# Patient Record
Sex: Female | Born: 1955 | Marital: Married | Smoking: Current every day smoker
Health system: Southern US, Community
[De-identification: ages and names within clinical notes are randomized; demographics above are authoritative.]

## PROBLEM LIST (undated history)

## (undated) DIAGNOSIS — I10 Essential (primary) hypertension: Secondary | ICD-10-CM

## (undated) DIAGNOSIS — E785 Hyperlipidemia, unspecified: Secondary | ICD-10-CM

## (undated) DIAGNOSIS — R519 Headache, unspecified: Secondary | ICD-10-CM

## (undated) DIAGNOSIS — R51 Headache: Secondary | ICD-10-CM

## (undated) DIAGNOSIS — F17200 Nicotine dependence, unspecified, uncomplicated: Secondary | ICD-10-CM

## (undated) HISTORY — PX: APPENDECTOMY: SHX54

---

## 2015-11-16 ENCOUNTER — Ambulatory Visit: Payer: Self-pay | Admitting: General Surgery

## 2015-12-23 ENCOUNTER — Encounter (HOSPITAL_BASED_OUTPATIENT_CLINIC_OR_DEPARTMENT_OTHER): Payer: Self-pay | Admitting: *Deleted

## 2015-12-28 ENCOUNTER — Other Ambulatory Visit: Payer: Self-pay | Admitting: General Surgery

## 2015-12-28 ENCOUNTER — Ambulatory Visit
Admission: RE | Admit: 2015-12-28 | Discharge: 2015-12-28 | Disposition: A | Discharge status: Home / Self Care | Payer: 59 | Source: Ambulatory Visit | Attending: General Surgery | Admitting: General Surgery

## 2015-12-28 ENCOUNTER — Encounter (HOSPITAL_BASED_OUTPATIENT_CLINIC_OR_DEPARTMENT_OTHER)
Admission: RE | Admit: 2015-12-28 | Discharge: 2015-12-28 | Disposition: A | Discharge status: Home / Self Care | Payer: 59 | Source: Ambulatory Visit | Attending: General Surgery | Admitting: General Surgery

## 2015-12-28 DIAGNOSIS — Z01811 Encounter for preprocedural respiratory examination: Secondary | ICD-10-CM

## 2015-12-28 DIAGNOSIS — Z01812 Encounter for preprocedural laboratory examination: Secondary | ICD-10-CM | POA: Insufficient documentation

## 2015-12-28 DIAGNOSIS — Z01810 Encounter for preprocedural cardiovascular examination: Secondary | ICD-10-CM | POA: Insufficient documentation

## 2015-12-28 LAB — CBC WITH DIFFERENTIAL/PLATELET
BASOS ABS: 0 10*3/uL (ref 0.0–0.1)
Basophils Relative: 0 %
EOS ABS: 0.1 10*3/uL (ref 0.0–0.7)
EOS PCT: 2 %
HCT: 42.1 % (ref 36.0–46.0)
Hemoglobin: 14.4 g/dL (ref 12.0–15.0)
LYMPHS ABS: 3.1 10*3/uL (ref 0.7–4.0)
Lymphocytes Relative: 34 %
MCH: 30.3 pg (ref 26.0–34.0)
MCHC: 34.2 g/dL (ref 30.0–36.0)
MCV: 88.4 fL (ref 78.0–100.0)
Monocytes Absolute: 0.6 10*3/uL (ref 0.1–1.0)
Monocytes Relative: 6 %
Neutro Abs: 5.5 10*3/uL (ref 1.7–7.7)
Neutrophils Relative %: 58 %
PLATELETS: 282 10*3/uL (ref 150–400)
RBC: 4.76 MIL/uL (ref 3.87–5.11)
RDW: 15.5 % (ref 11.5–15.5)
WBC: 9.4 10*3/uL (ref 4.0–10.5)

## 2015-12-28 LAB — BASIC METABOLIC PANEL
Anion gap: 6 (ref 5–15)
BUN: 16 mg/dL (ref 6–20)
CALCIUM: 10 mg/dL (ref 8.9–10.3)
CO2: 27 mmol/L (ref 22–32)
CREATININE: 0.62 mg/dL (ref 0.44–1.00)
Chloride: 107 mmol/L (ref 101–111)
GFR calc Af Amer: >60 mL/min (ref >60)
Glucose, Bld: 92 mg/dL (ref 65–99)
Potassium: 5.1 mmol/L (ref 3.5–5.1)
SODIUM: 140 mmol/L (ref 135–145)

## 2015-12-28 NOTE — Progress Notes (Signed)
Pt arrived for PAT appointment. Blood work and EKG done. Reviewed result of EKG with Dr Renold DonGermeroth who requests we get an old one for comparison before DOS if one exists. Spoke with pt who says her [place] is in [place] and she's never had one done before. She reports she has an upcoming apt with that MD very soon and she will have their office contact the hospital for results of blood and EKG for their records. Pt sent for CXR.

## 2016-01-02 ENCOUNTER — Encounter (HOSPITAL_BASED_OUTPATIENT_CLINIC_OR_DEPARTMENT_OTHER): Payer: Self-pay | Admitting: Certified Registered"

## 2016-01-02 ENCOUNTER — Ambulatory Visit (HOSPITAL_BASED_OUTPATIENT_CLINIC_OR_DEPARTMENT_OTHER): Payer: 59 | Admitting: Certified Registered"

## 2016-01-02 ENCOUNTER — Encounter (HOSPITAL_BASED_OUTPATIENT_CLINIC_OR_DEPARTMENT_OTHER)
Admission: RE | Disposition: A | Discharge status: Home / Self Care | Payer: Self-pay | Source: Ambulatory Visit | Attending: General Surgery

## 2016-01-02 ENCOUNTER — Ambulatory Visit (HOSPITAL_BASED_OUTPATIENT_CLINIC_OR_DEPARTMENT_OTHER)
Admission: RE | Admit: 2016-01-02 | Discharge: 2016-01-02 | Disposition: A | Discharge status: Home / Self Care | Payer: 59 | Source: Ambulatory Visit | Attending: General Surgery | Admitting: General Surgery

## 2016-01-02 DIAGNOSIS — Z833 Family history of diabetes mellitus: Secondary | ICD-10-CM | POA: Insufficient documentation

## 2016-01-02 DIAGNOSIS — Z8261 Family history of arthritis: Secondary | ICD-10-CM | POA: Diagnosis not present

## 2016-01-02 DIAGNOSIS — F17210 Nicotine dependence, cigarettes, uncomplicated: Secondary | ICD-10-CM | POA: Insufficient documentation

## 2016-01-02 DIAGNOSIS — E7800 Pure hypercholesterolemia, unspecified: Secondary | ICD-10-CM | POA: Diagnosis not present

## 2016-01-02 DIAGNOSIS — I10 Essential (primary) hypertension: Secondary | ICD-10-CM | POA: Insufficient documentation

## 2016-01-02 DIAGNOSIS — Z8249 Family history of ischemic heart disease and other diseases of the circulatory system: Secondary | ICD-10-CM | POA: Diagnosis not present

## 2016-01-02 DIAGNOSIS — Z823 Family history of stroke: Secondary | ICD-10-CM | POA: Insufficient documentation

## 2016-01-02 DIAGNOSIS — K4090 Unilateral inguinal hernia, without obstruction or gangrene, not specified as recurrent: Secondary | ICD-10-CM | POA: Insufficient documentation

## 2016-01-02 HISTORY — DX: Hyperlipidemia, unspecified: E78.5

## 2016-01-02 HISTORY — PX: INSERTION OF MESH: SHX5868

## 2016-01-02 HISTORY — DX: Nicotine dependence, unspecified, uncomplicated: F17.200

## 2016-01-02 HISTORY — DX: Headache, unspecified: R51.9

## 2016-01-02 HISTORY — DX: Essential (primary) hypertension: I10

## 2016-01-02 HISTORY — PX: INGUINAL HERNIA REPAIR: SHX194

## 2016-01-02 HISTORY — DX: Headache: R51

## 2016-01-02 SURGERY — REPAIR, HERNIA, INGUINAL, ADULT
Anesthesia: General | Site: Inguinal | Laterality: Right

## 2016-01-02 MED ORDER — SODIUM BICARBONATE 4 % IV SOLN
INTRAVENOUS | Status: AC
  Filled 2016-01-02: qty 5

## 2016-01-02 MED ORDER — FENTANYL CITRATE (PF) 100 MCG/2ML IJ SOLN
50.0000 ug | INTRAMUSCULAR | Status: AC | PRN
  Administered 2016-01-02 (×3): 50 ug via INTRAVENOUS

## 2016-01-02 MED ORDER — LACTATED RINGERS IV SOLN
INTRAVENOUS | Status: DC
  Administered 2016-01-02 (×2): via INTRAVENOUS

## 2016-01-02 MED ORDER — LIDOCAINE 2% (20 MG/ML) 5 ML SYRINGE
INTRAMUSCULAR | Status: AC
  Filled 2016-01-02: qty 5

## 2016-01-02 MED ORDER — OXYCODONE HCL 5 MG PO TABS: 5.0000 mg | ORAL_TABLET | Freq: Once | ORAL | Status: DC | PRN

## 2016-01-02 MED ORDER — CHLORHEXIDINE GLUCONATE CLOTH 2 % EX PADS: 6.0000 | MEDICATED_PAD | Freq: Once | CUTANEOUS | Status: DC

## 2016-01-02 MED ORDER — SODIUM CHLORIDE 0.9 % IR SOLN
Status: DC | PRN
  Administered 2016-01-02: 500 mL

## 2016-01-02 MED ORDER — DEXAMETHASONE SODIUM PHOSPHATE 10 MG/ML IJ SOLN
INTRAMUSCULAR | Status: AC
  Filled 2016-01-02: qty 1

## 2016-01-02 MED ORDER — SCOPOLAMINE 1 MG/3DAYS TD PT72: 1.0000 | MEDICATED_PATCH | Freq: Once | TRANSDERMAL | Status: DC | PRN

## 2016-01-02 MED ORDER — HYDROMORPHONE HCL 1 MG/ML IJ SOLN: 0.2500 mg | INTRAMUSCULAR | Status: DC | PRN

## 2016-01-02 MED ORDER — BUPIVACAINE HCL (PF) 0.5 % IJ SOLN
INTRAMUSCULAR | Status: DC | PRN
  Administered 2016-01-02: 18 mL

## 2016-01-02 MED ORDER — FENTANYL CITRATE (PF) 100 MCG/2ML IJ SOLN
INTRAMUSCULAR | Status: AC
  Filled 2016-01-02: qty 2

## 2016-01-02 MED ORDER — DEXAMETHASONE SODIUM PHOSPHATE 4 MG/ML IJ SOLN
INTRAMUSCULAR | Status: DC | PRN
  Administered 2016-01-02: 10 mg via INTRAVENOUS

## 2016-01-02 MED ORDER — MIDAZOLAM HCL 2 MG/2ML IJ SOLN
1.0000 mg | INTRAMUSCULAR | Status: DC | PRN
  Administered 2016-01-02: 1 mg via INTRAVENOUS

## 2016-01-02 MED ORDER — BUPIVACAINE HCL (PF) 0.5 % IJ SOLN
INTRAMUSCULAR | Status: AC
  Filled 2016-01-02: qty 30

## 2016-01-02 MED ORDER — CEFAZOLIN SODIUM-DEXTROSE 2-4 GM/100ML-% IV SOLN
2.0000 g | INTRAVENOUS | Status: AC
  Administered 2016-01-02: 2 g via INTRAVENOUS

## 2016-01-02 MED ORDER — EPHEDRINE SULFATE 50 MG/ML IJ SOLN
INTRAMUSCULAR | Status: DC | PRN
  Administered 2016-01-02: 10 mg via INTRAVENOUS

## 2016-01-02 MED ORDER — PROPOFOL 10 MG/ML IV BOLUS
INTRAVENOUS | Status: DC | PRN
  Administered 2016-01-02: 150 mg via INTRAVENOUS

## 2016-01-02 MED ORDER — MEPERIDINE HCL 25 MG/ML IJ SOLN: 6.2500 mg | INTRAMUSCULAR | Status: DC | PRN

## 2016-01-02 MED ORDER — ONDANSETRON HCL 4 MG/2ML IJ SOLN
INTRAMUSCULAR | Status: AC
  Filled 2016-01-02: qty 2

## 2016-01-02 MED ORDER — OXYCODONE HCL 5 MG/5ML PO SOLN: 5.0000 mg | Freq: Once | ORAL | Status: DC | PRN

## 2016-01-02 MED ORDER — GABAPENTIN 300 MG PO CAPS
300.0000 mg | ORAL_CAPSULE | ORAL | Status: AC
  Administered 2016-01-02: 300 mg via ORAL

## 2016-01-02 MED ORDER — MIDAZOLAM HCL 2 MG/2ML IJ SOLN
INTRAMUSCULAR | Status: AC
  Filled 2016-01-02: qty 2

## 2016-01-02 MED ORDER — PROMETHAZINE HCL 25 MG/ML IJ SOLN: 6.2500 mg | INTRAMUSCULAR | Status: DC | PRN

## 2016-01-02 MED ORDER — ONDANSETRON HCL 4 MG/2ML IJ SOLN
INTRAMUSCULAR | Status: DC | PRN
  Administered 2016-01-02: 4 mg via INTRAVENOUS

## 2016-01-02 MED ORDER — LIDOCAINE HCL (PF) 1 % IJ SOLN
INTRAMUSCULAR | Status: AC
  Filled 2016-01-02: qty 30

## 2016-01-02 MED ORDER — PROPOFOL 500 MG/50ML IV EMUL
INTRAVENOUS | Status: AC
  Filled 2016-01-02: qty 50

## 2016-01-02 MED ORDER — ACETAMINOPHEN 500 MG PO TABS
1000.0000 mg | ORAL_TABLET | ORAL | Status: AC
  Administered 2016-01-02: 1000 mg via ORAL

## 2016-01-02 MED ORDER — OXYCODONE HCL 5 MG PO TABS: 5.0000 mg | ORAL_TABLET | Freq: Once | ORAL | 0 refills | Status: AC | PRN

## 2016-01-02 MED ORDER — LIDOCAINE HCL (CARDIAC) 20 MG/ML IV SOLN
INTRAVENOUS | Status: DC | PRN
  Administered 2016-01-02: 60 mg via INTRAVENOUS

## 2016-01-02 SURGICAL SUPPLY — 56 items
BAG DECANTER FOR FLEXI CONT (MISCELLANEOUS) ×3 IMPLANT
BLADE CLIPPER SURG (BLADE) ×3 IMPLANT
BLADE SURG 10 STRL SS (BLADE) ×3 IMPLANT
BLADE SURG 15 STRL LF DISP TIS (BLADE) ×1 IMPLANT
BLADE SURG 15 STRL SS (BLADE) ×2
CANISTER SUCT 1200ML W/VALVE (MISCELLANEOUS) IMPLANT
CHLORAPREP W/TINT 26ML (MISCELLANEOUS) ×3 IMPLANT
CLEANER CAUTERY TIP 5X5 PAD (MISCELLANEOUS) ×1 IMPLANT
CLOSURE WOUND 1/2 X4 (GAUZE/BANDAGES/DRESSINGS) ×1
COVER BACK TABLE 60X90IN (DRAPES) ×3 IMPLANT
COVER MAYO STAND STRL (DRAPES) ×3 IMPLANT
DECANTER SPIKE VIAL GLASS SM (MISCELLANEOUS) ×3 IMPLANT
DERMABOND ADVANCED (GAUZE/BANDAGES/DRESSINGS) ×2
DERMABOND ADVANCED .7 DNX12 (GAUZE/BANDAGES/DRESSINGS) ×1 IMPLANT
DRAIN PENROSE 1/2X12 LTX STRL (WOUND CARE) ×3 IMPLANT
DRAPE LAPAROTOMY TRNSV 102X78 (DRAPE) ×3 IMPLANT
DRAPE UTILITY XL STRL (DRAPES) ×3 IMPLANT
DRSG TEGADERM 2-3/8X2-3/4 SM (GAUZE/BANDAGES/DRESSINGS) IMPLANT
DRSG TEGADERM 4X4.75 (GAUZE/BANDAGES/DRESSINGS) ×3 IMPLANT
ELECT REM PT RETURN 9FT ADLT (ELECTROSURGICAL) ×3
ELECTRODE REM PT RTRN 9FT ADLT (ELECTROSURGICAL) ×1 IMPLANT
GLOVE BIOGEL PI IND STRL 7.5 (GLOVE) ×1 IMPLANT
GLOVE BIOGEL PI IND STRL 8 (GLOVE) ×1 IMPLANT
GLOVE BIOGEL PI INDICATOR 7.5 (GLOVE) ×2
GLOVE BIOGEL PI INDICATOR 8 (GLOVE) ×2
GLOVE ECLIPSE 7.5 STRL STRAW (GLOVE) ×3 IMPLANT
GLOVE SURG SYN 7.5  E (GLOVE) ×2
GLOVE SURG SYN 7.5 E (GLOVE) ×1 IMPLANT
GOWN STRL REUS W/ TWL LRG LVL3 (GOWN DISPOSABLE) ×1 IMPLANT
GOWN STRL REUS W/ TWL XL LVL3 (GOWN DISPOSABLE) ×1 IMPLANT
GOWN STRL REUS W/TWL LRG LVL3 (GOWN DISPOSABLE) ×2
GOWN STRL REUS W/TWL XL LVL3 (GOWN DISPOSABLE) ×2
MESH HERNIA 3X6 (Mesh General) ×3 IMPLANT
NEEDLE HYPO 25X1 1.5 SAFETY (NEEDLE) ×3 IMPLANT
NS IRRIG 1000ML POUR BTL (IV SOLUTION) IMPLANT
PACK BASIN DAY SURGERY FS (CUSTOM PROCEDURE TRAY) ×3 IMPLANT
PAD CLEANER CAUTERY TIP 5X5 (MISCELLANEOUS) ×2
PENCIL BUTTON HOLSTER BLD 10FT (ELECTRODE) ×3 IMPLANT
SLEEVE SCD COMPRESS KNEE MED (MISCELLANEOUS) ×3 IMPLANT
SPONGE INTESTINAL PEANUT (DISPOSABLE) ×3 IMPLANT
SPONGE LAP 4X18 X RAY DECT (DISPOSABLE) ×3 IMPLANT
STRIP CLOSURE SKIN 1/2X4 (GAUZE/BANDAGES/DRESSINGS) ×2 IMPLANT
SUT ETHIBOND 0 MO6 C/R (SUTURE) ×3 IMPLANT
SUT MON AB 4-0 PC3 18 (SUTURE) ×3 IMPLANT
SUT PROLENE 0 CT 2 (SUTURE) ×6 IMPLANT
SUT VIC AB 3-0 SH 27 (SUTURE) ×4
SUT VIC AB 3-0 SH 27X BRD (SUTURE) ×2 IMPLANT
SUT VICRYL 4-0 PS2 18IN ABS (SUTURE) IMPLANT
SUT VICRYL AB 3 0 TIES (SUTURE) IMPLANT
SYR BULB 3OZ (MISCELLANEOUS) ×3 IMPLANT
SYR CONTROL 10ML LL (SYRINGE) ×3 IMPLANT
TOWEL OR 17X24 6PK STRL BLUE (TOWEL DISPOSABLE) ×3 IMPLANT
TOWEL OR NON WOVEN STRL DISP B (DISPOSABLE) ×3 IMPLANT
TUBE CONNECTING 20'X1/4 (TUBING)
TUBE CONNECTING 20X1/4 (TUBING) IMPLANT
YANKAUER SUCT BULB TIP NO VENT (SUCTIONS) IMPLANT

## 2016-01-02 NOTE — Anesthesia Preprocedure Evaluation (Addendum)
Anesthesia Evaluation  Patient identified by MRN, date of birth, ID band Patient awake    Reviewed: Allergy & Precautions, NPO status , Patient's Chart, lab work & pertinent test results  Airway Mallampati: II  TM Distance: >3 FB Neck ROM: Full    Dental no notable dental hx.    Pulmonary Current Smoker,    Pulmonary exam normal breath sounds clear to auscultation       Cardiovascular hypertension, Pt. on medications Normal cardiovascular exam Rhythm:Regular Rate:Normal     Neuro/Psych  Headaches, negative neurological ROS  negative psych ROS   GI/Hepatic negative GI ROS, Neg liver ROS,   Endo/Other  negative endocrine ROS  Renal/GU negative Renal ROS     Musculoskeletal negative musculoskeletal ROS (+)   Abdominal   Peds  Hematology negative hematology ROS (+)   Anesthesia Other Findings   Reproductive/Obstetrics negative OB ROS                                                             Anesthesia Evaluation    Airway        Dental   Pulmonary Current Smoker,           Cardiovascular hypertension, Pt. on medications      Neuro/Psych  Headaches,    GI/Hepatic   Endo/Other    Renal/GU      Musculoskeletal   Abdominal   Peds  Hematology   Anesthesia Other Findings   Reproductive/Obstetrics                             Anesthesia Physical Anesthesia Plan Anesthesia Quick Evaluation  Anesthesia Physical Anesthesia Plan  ASA: II  Anesthesia Plan: General   Post-op Pain Management:    Induction: Intravenous  Airway Management Planned: LMA  Additional Equipment:   Intra-op Plan:   Post-operative Plan: Extubation in OR  Informed Consent: I have reviewed the patients History and Physical, chart, labs and discussed the procedure including the risks, benefits and alternatives for the proposed anesthesia with the  patient or authorized representative who has indicated his/her understanding and acceptance.   Dental advisory given  Plan Discussed with: CRNA  Anesthesia Plan Comments: (Discussed TAP block with patient, but she declined. I offered to evaluate in PACU to see if she would benefit from post op block and she agreed.)       Anesthesia Quick Evaluation

## 2016-01-02 NOTE — Anesthesia Postprocedure Evaluation (Signed)
Anesthesia Post Note  Patient: Virginia HopesGabriela Kelly  Procedure(s) Performed: Procedure(s) (LRB): OPEN RIGHT INGUINAL HERNIA REPAIR WITH MESH (Right) INSERTION OF MESH (Right)  Patient location during evaluation: PACU Anesthesia Type: General Level of consciousness: awake and alert Pain management: pain level controlled Vital Signs Assessment: post-procedure vital signs reviewed and stable Respiratory status: spontaneous breathing, nonlabored ventilation and respiratory function stable Cardiovascular status: blood pressure returned to baseline and stable Postop Assessment: no signs of nausea or vomiting Anesthetic complications: no    Last Vitals:  Vitals:   01/02/16 0928 01/02/16 0941  BP:  (!) 160/71  Pulse: 79 83  Resp: 20 20  Temp:  36.7 C    Last Pain:  Vitals:   01/02/16 0941  TempSrc:   PainSc: 2                  Elliemae Braman,W. EDMOND

## 2016-01-02 NOTE — Op Note (Signed)
OPERATIVE REPORT  DATE OF OPERATION: 01/02/2016  PATIENT:  Virginia Kelly  60 y.o. female  PRE-OPERATIVE DIAGNOSIS:  RIGHT INGUINAL HERNIA, DIRECT  POST-OPERATIVE DIAGNOSIS:  RIGHT INGUINAL HERNIA, DIRECT  INDICATION(S) FOR OPERATION:  Symptomatic right inguina hernia  FINDINGS:  Direct defect in the transversalis fascia medially  PROCEDURE:  Procedure(s): OPEN RIGHT INGUINAL HERNIA REPAIR WITH MESH INSERTION OF MESH  SURGEON:  Surgeon(s): Jimmye NormanJames Rogerio Boutelle, MD  ASSISTANT: None  ANESTHESIA:   local, general and LMA  COMPLICATIONS:  None  EBL: <5 ml  BLOOD ADMINISTERED: none  DRAINS: none   SPECIMEN:  No Specimen  COUNTS CORRECT:  YES  PROCEDURE DETAILS: The patient was taken to the operating room and placed on the table in supine position. After an adequate general laryngeal airway anesthetic was administered, she was prepped and draped in the usual sterile manner exposing the right inguinal area.  A proper timeout was performed identifying the patient and the procedure to be performed. We marked the area for the incision and made a right 5-6 cm transverse curvilinear incision over the superficial ring. We took it down to and through the the subcutaneous tissue, Scarpa's fascia, down to the aponeurosis of the external oblique muscle. We split the external oblique fibers down through the superficial ring identifying the round ligament and mobilizing along with the ilioinguinal nerve.  Upon palpating the inguinal canal a small transversalis fascia direct hernia defect was noted just lateral to the pubic tubercle. We oversewed this area with 0 Ethibond sutures then subsequently used an oval piece of polypropylene mesh measuring approximately 4 x 2 cm in size attaching it to the conjoined tendon and the reflected portion of the inguinal ligament using a running 0 Prolene suture.  The mesh was soaked in antibiotic solution prior to being implanted. We irrigated the wound with  antibiotic solution then we closed up to along the ligament fall back into the inguinal canal. The external oblique fascia was closed using running 3-0 Vicryl suture. We then reapproximated Scarpa's fascia using interrupted 0 Vicryl sutures. The skin was then closed using running subcuticular stitch of 4-0 Monocryl. 0.5% Marcaine without epinephrine was injected into the subcutaneous tissue prior to closure. Dermabond, Steri-Strips, and Tegaderms views complete the dressings.  All needle counts sponge counts, and instrument counts were correct.  PATIENT DISPOSITION:  PACU - hemodynamically stable.   Jaquawn Saffran 11/27/20178:43 AM

## 2016-01-02 NOTE — Transfer of Care (Signed)
Immediate Anesthesia Transfer of Care Note  Patient: Virginia Kelly  Procedure(s) Performed: Procedure(s): OPEN RIGHT INGUINAL HERNIA REPAIR WITH MESH (Right) INSERTION OF MESH (Right)  Patient Location: PACU  Anesthesia Type:General  Level of Consciousness: awake and patient cooperative  Airway & Oxygen Therapy: Patient Spontanous Breathing and Patient connected to face mask oxygen  Post-op Assessment: Report given to RN and Post -op Vital signs reviewed and stable  Post vital signs: Reviewed and stable  Last Vitals:  Vitals:   01/02/16 0653  BP: (!) 146/77  Pulse: 72  Resp: 20  Temp: 36.8 C    Last Pain:  Vitals:   01/02/16 0653  TempSrc: Oral         Complications: No apparent anesthesia complications

## 2016-01-02 NOTE — Discharge Instructions (Signed)
Open Hernia Repair, Adult, Care After These instructions give you information about caring for yourself after your procedure. Your doctor may also give you more specific instructions. If you have problems or questions, contact your doctor. Follow these instructions at home: Surgical cut (incision) care   Follow instructions from your doctor about how to take care of your surgical cut area. Make sure you:  Wash your hands with soap and water before you change your bandage (dressing). If you cannot use soap and water, use hand sanitizer.  Change your bandage as told by your doctor.  Leave stitches (sutures), skin glue, or skin tape (adhesive) strips in place. They may need to stay in place for 2 weeks or longer. If tape strips get loose and curl up, you may trim the loose edges. Do not remove tape strips completely unless your doctor says it is okay.  Check your surgical cut every day for signs of infection. Check for:  More redness, swelling, or pain.  More fluid or blood.  Warmth.  Pus or a bad smell. Activity  Do not drive or use heavy machinery while taking prescription pain medicine. Do not drive until your doctor says it is okay.  Until your doctor says it is okay:  Do not lift anything that is heavier than 10 lb (4.5 kg).  Do not play contact sports.  Return to your normal activities as told by your doctor. Ask your doctor what activities are safe. General instructions  To prevent or treat having a hard time pooping (constipation) while you are taking prescription pain medicine, your doctor may recommend that you:  Drink enough fluid to keep your pee (urine) clear or pale yellow.  Take over-the-counter or prescription medicines.  Eat foods that are high in fiber, such as fresh fruits and vegetables, whole grains, and beans.  Limit foods that are high in fat and processed sugars, such as fried and sweet foods.  Take over-the-counter and prescription medicines only as  told by your doctor.  Do not take baths, swim, or use a hot tub until your doctor says it is okay.  Yo may shower and pat the area dry.  Leave palstic dressing intact until seen in clinic if possible  Keep all follow-up visits as told by your doctor. This is important. Contact a doctor if:  You develop a rash.  You have more redness, swelling, or pain around your surgical cut.  You have more fluid or blood coming from your surgical cut.  Your surgical cut feels warm to the touch.  You have pus or a bad smell coming from your surgical cut.  You have a fever or chills.  You have blood in your poop (stool).  You have not pooped in 2-3 days.  Medicine does not help your pain. Get help right away if:  You have chest pain or you are short of breath.  You feel light-headed.  You feel weak and dizzy (feel faint).  You have very bad pain.  You throw up (vomit) and your pain is worse. This information is not intended to replace advice given to you by your health care provider. Make sure you discuss any questions you have with your health care provider.    Post Anesthesia Home Care Instructions  Activity: Get plenty of rest for the remainder of the day. A responsible adult should stay with you for 24 hours following the procedure.  For the next 24 hours, DO NOT: -Drive a car Environmental consultant-Operate machinery -Drink  alcoholic beverages -Take any medication unless instructed by your physician -Make any legal decisions or sign important papers.  Meals: Start with liquid foods such as gelatin or soup. Progress to regular foods as tolerated. Avoid greasy, spicy, heavy foods. If nausea and/or vomiting occur, drink only clear liquids until the nausea and/or vomiting subsides. Call your physician if vomiting continues.  Special Instructions/Symptoms: Your throat may feel dry or sore from the anesthesia or the breathing tube placed in your throat during surgery. If this causes discomfort,  gargle with warm salt water. The discomfort should disappear within 24 hours.  If you had a scopolamine patch placed behind your ear for the management of post- operative nausea and/or vomiting:  1. The medication in the patch is effective for 72 hours, after which it should be removed.  Wrap patch in a tissue and discard in the trash. Wash hands thoroughly with soap and water. 2. You may remove the patch earlier than 72 hours if you experience unpleasant side effects which may include dry mouth, dizziness or visual disturbances. 3. Avoid touching the patch. Wash your hands with soap and water after contact with the patch.   Call your surgeon if you experience:   1.  Fever over 101.0. 2.  Inability to urinate. 3.  Nausea and/or vomiting. 4.  Extreme swelling or bruising at the surgical site. 5.  Continued bleeding from the incision. 6.  Increased pain, redness or drainage from the incision. 7.  Problems related to your pain medication. 8.  Any problems and/or concerns

## 2016-01-02 NOTE — H&P (Signed)
Virginia Kelly 11/15/2015 10:23 AM Location: [facility] Patient #: [id number] DOB: 1956/02/03 Married / Language: English / Race: Refused to Report/Unreported Female   The patient is a 60 year old female.  Other Problems Virginia Kelly(Virginia Kelly, CMA; 11/15/2015 10:24 AM) Back Pain Hypercholesterolemia  Past Surgical History Virginia Kelly(Virginia Kelly, CMA; 11/15/2015 10:24 AM) Appendectomy Bypass Surgery for Poor Blood Flow to Legs  Diagnostic Studies History Virginia Kelly(Virginia Kelly, CMA; 11/15/2015 10:24 AM) Colonoscopy 5-10 years ago  Allergies Virginia Kelly(Virginia Kelly, CMA; 11/15/2015 10:24 AM) No Known Drug Allergies10/11/2015  Medication History (Virginia Kelly, CMA; 11/15/2015 10:25 AM) Rosuvastatin Calcium (10MG  Tablet, Oral) Active. Lisinopril-Hydrochlorothiazide (20-12.5MG  Tablet, Oral) Active. Medications Reconciled  Social History Virginia Kelly(Virginia Kelly, CMA; 11/15/2015 10:24 AM) Alcohol use Occasional alcohol use. Caffeine use Coffee. No drug use Tobacco use Current every day smoker.  Family History Virginia Kelly(Virginia Kelly, CMA; 11/15/2015 10:24 AM) Arthritis Father. Bleeding disorder Mother. Breast Cancer Mother. Cerebrovascular Accident Father. Diabetes Mellitus Father. Hypertension Father, Mother.    Review of Systems Virginia Kelly(Virginia Kelly CMA; 11/15/2015 10:24 AM) General Not Present- Appetite Loss, Chills, Fatigue, Fever, Night Sweats, Weight Gain and Weight Loss. Skin Not Present- Change in Wart/Mole, Dryness, Hives, Jaundice, New Lesions, Non-Healing Wounds, Rash and Ulcer. HEENT Present- Wears glasses/contact lenses. Not Present- Earache, Hearing Loss, Hoarseness, Nose Bleed, Oral Ulcers, Ringing in the Ears, Seasonal Allergies, Sinus Pain, Sore Throat, Visual Disturbances and Yellow Eyes. Respiratory Not Present- Bloody sputum, Chronic Cough, Difficulty Breathing, Snoring and Wheezing. Cardiovascular Not Present- Chest Pain, Difficulty Breathing Lying Down, Leg Cramps, Palpitations, Rapid Heart  Rate, Shortness of Breath and Swelling of Extremities. Gastrointestinal Not Present- Abdominal Pain, Bloating, Bloody Stool, Change in Bowel Habits, Chronic diarrhea, Constipation, Difficulty Swallowing, Excessive gas, Gets full quickly at meals, Hemorrhoids, Indigestion, Nausea, Rectal Pain and Vomiting. Female Genitourinary Not Present- Frequency, Nocturia, Painful Urination, Pelvic Pain and Urgency. Musculoskeletal Present- Back Pain. Not Present- Joint Pain, Joint Stiffness, Muscle Pain, Muscle Weakness and Swelling of Extremities. Neurological Not Present- Decreased Memory, Fainting, Headaches, Numbness, Seizures, Tingling, Tremor, Trouble walking and Weakness. Psychiatric Not Present- Anxiety, Bipolar, Change in Sleep Pattern, Depression, Fearful and Frequent crying. Endocrine Not Present- Cold Intolerance, Excessive Hunger, Hair Changes, Heat Intolerance, Hot flashes and New Diabetes. Hematology Not Present- Blood Thinners, Easy Bruising, Excessive bleeding, Gland problems, HIV and Persistent Infections.  Vitals (Virginia Kelly CMA; 11/15/2015 10:24 AM) 11/15/2015 10:24 AM Weight: 154 lb Height: 64in Body Surface Area: 1.75 m Body Mass Index: 26.43 kg/m  Temp.: 98.40F(Temporal)  Pulse: 80 (Regular)  BP: 124/74 (Sitting, Left Arm, Standard) Current BP 146/77. P 72   Physical Exam (Virginia Knutzen O. Shaheem Pichon MD; 11/15/2015 10:58 AM) Chest and Lung Exam Chest and lung exam reveals -normal excursion with symmetric chest walls and on auscultation, normal breath sounds, no adventitious sounds and normal vocal resonance.  Breath sounds equat.  Cardiovascular Cardiovascular examination reveals -on palpation PMI is normal in location and amplitude, no palpable S3 or S4. Normal cardiac borders., normal heart sounds, regular rate and rhythm with no murmurs and (see Vital Signs section for blood pressure measurements).  Abdomen:  Soft, nontender, good bowel sounds.  RIH, direct likely  confirmed.  Patient marked on the right hip by surgeon   Assessment & Plan Virginia Kelly(Virginia Cowie O. Davionna Blacksher MD; 11/15/2015 11:00 AM) RIGHT INGUINAL HERNIA (K40.90) Impression: Likely direct RIH, female, intermittetntly incarcerated, but always goes back in. Limits her activity. want surgical repair. Will schedule Current Plans: Open repair, will try not tu use mesh if possible.Virginia Kelly.    Virginia Kelly,  III, MD, FACS [phone]--pager [phone]--office [facility]

## 2016-01-02 NOTE — Anesthesia Procedure Notes (Signed)
Procedure Name: LMA Insertion Date/Time: 01/02/2016 7:35 AM Performed by: Ravneet Spilker D Pre-anesthesia Checklist: Patient identified, Emergency Drugs available, Suction available and Patient being monitored Patient Re-evaluated:Patient Re-evaluated prior to inductionOxygen Delivery Method: Circle system utilized Preoxygenation: Pre-oxygenation with 100% oxygen Intubation Type: IV induction Ventilation: Mask ventilation without difficulty LMA: LMA inserted LMA Size: 4.0 Number of attempts: 1 Airway Equipment and Method: Bite block Placement Confirmation: positive ETCO2 Tube secured with: Tape Dental Injury: Teeth and Oropharynx as per pre-operative assessment

## 2016-01-03 ENCOUNTER — Encounter (HOSPITAL_BASED_OUTPATIENT_CLINIC_OR_DEPARTMENT_OTHER): Payer: Self-pay | Admitting: General Surgery

## 2018-02-16 IMAGING — CR DG CHEST 2V
2 series · 2 of 2 positions shown · non-contrast
Comparison: None.

CLINICAL DATA: Preop for hernia surgery, smoking history,
hypertension

EXAM:
CHEST  2 VIEW

[w chest pa]
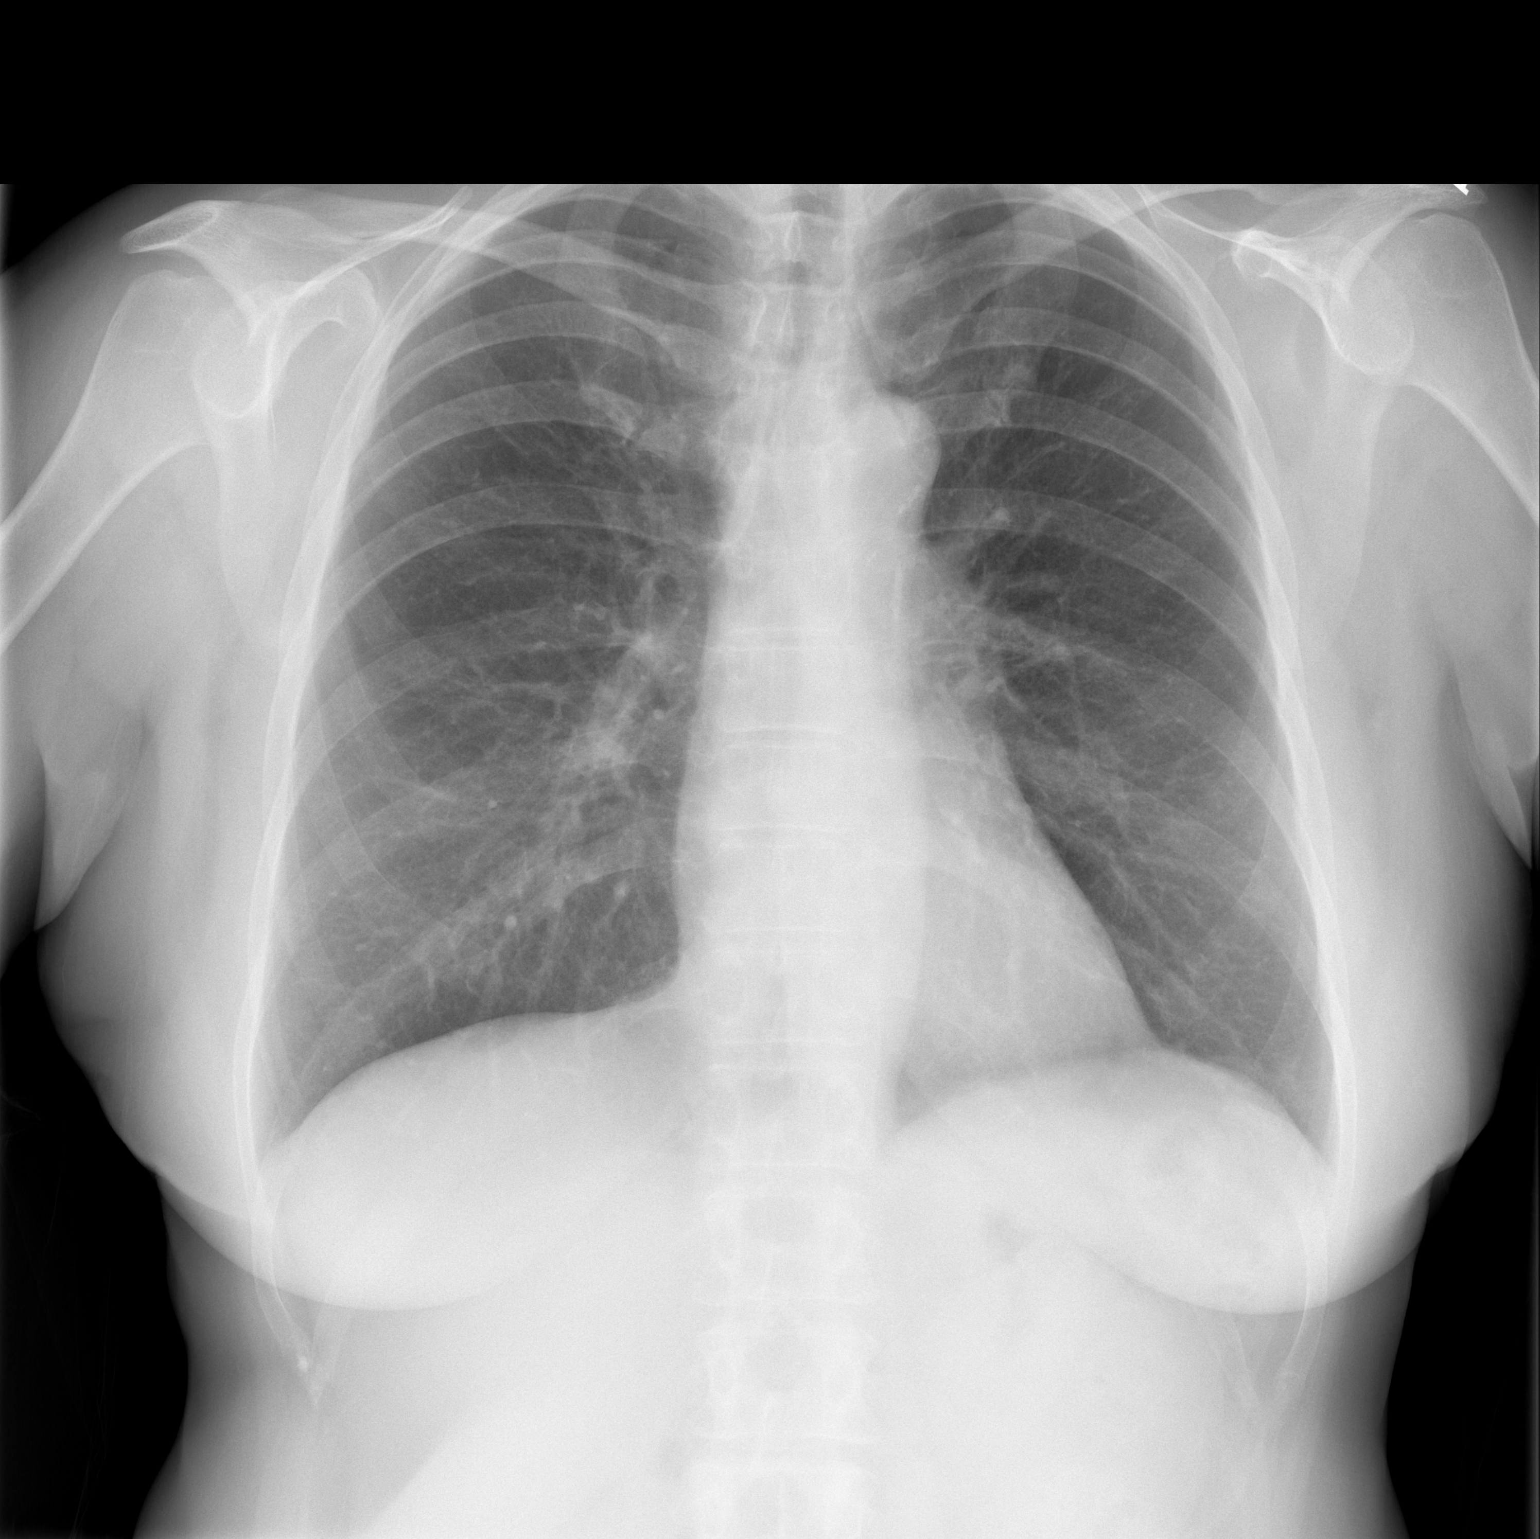

[w chest lat]
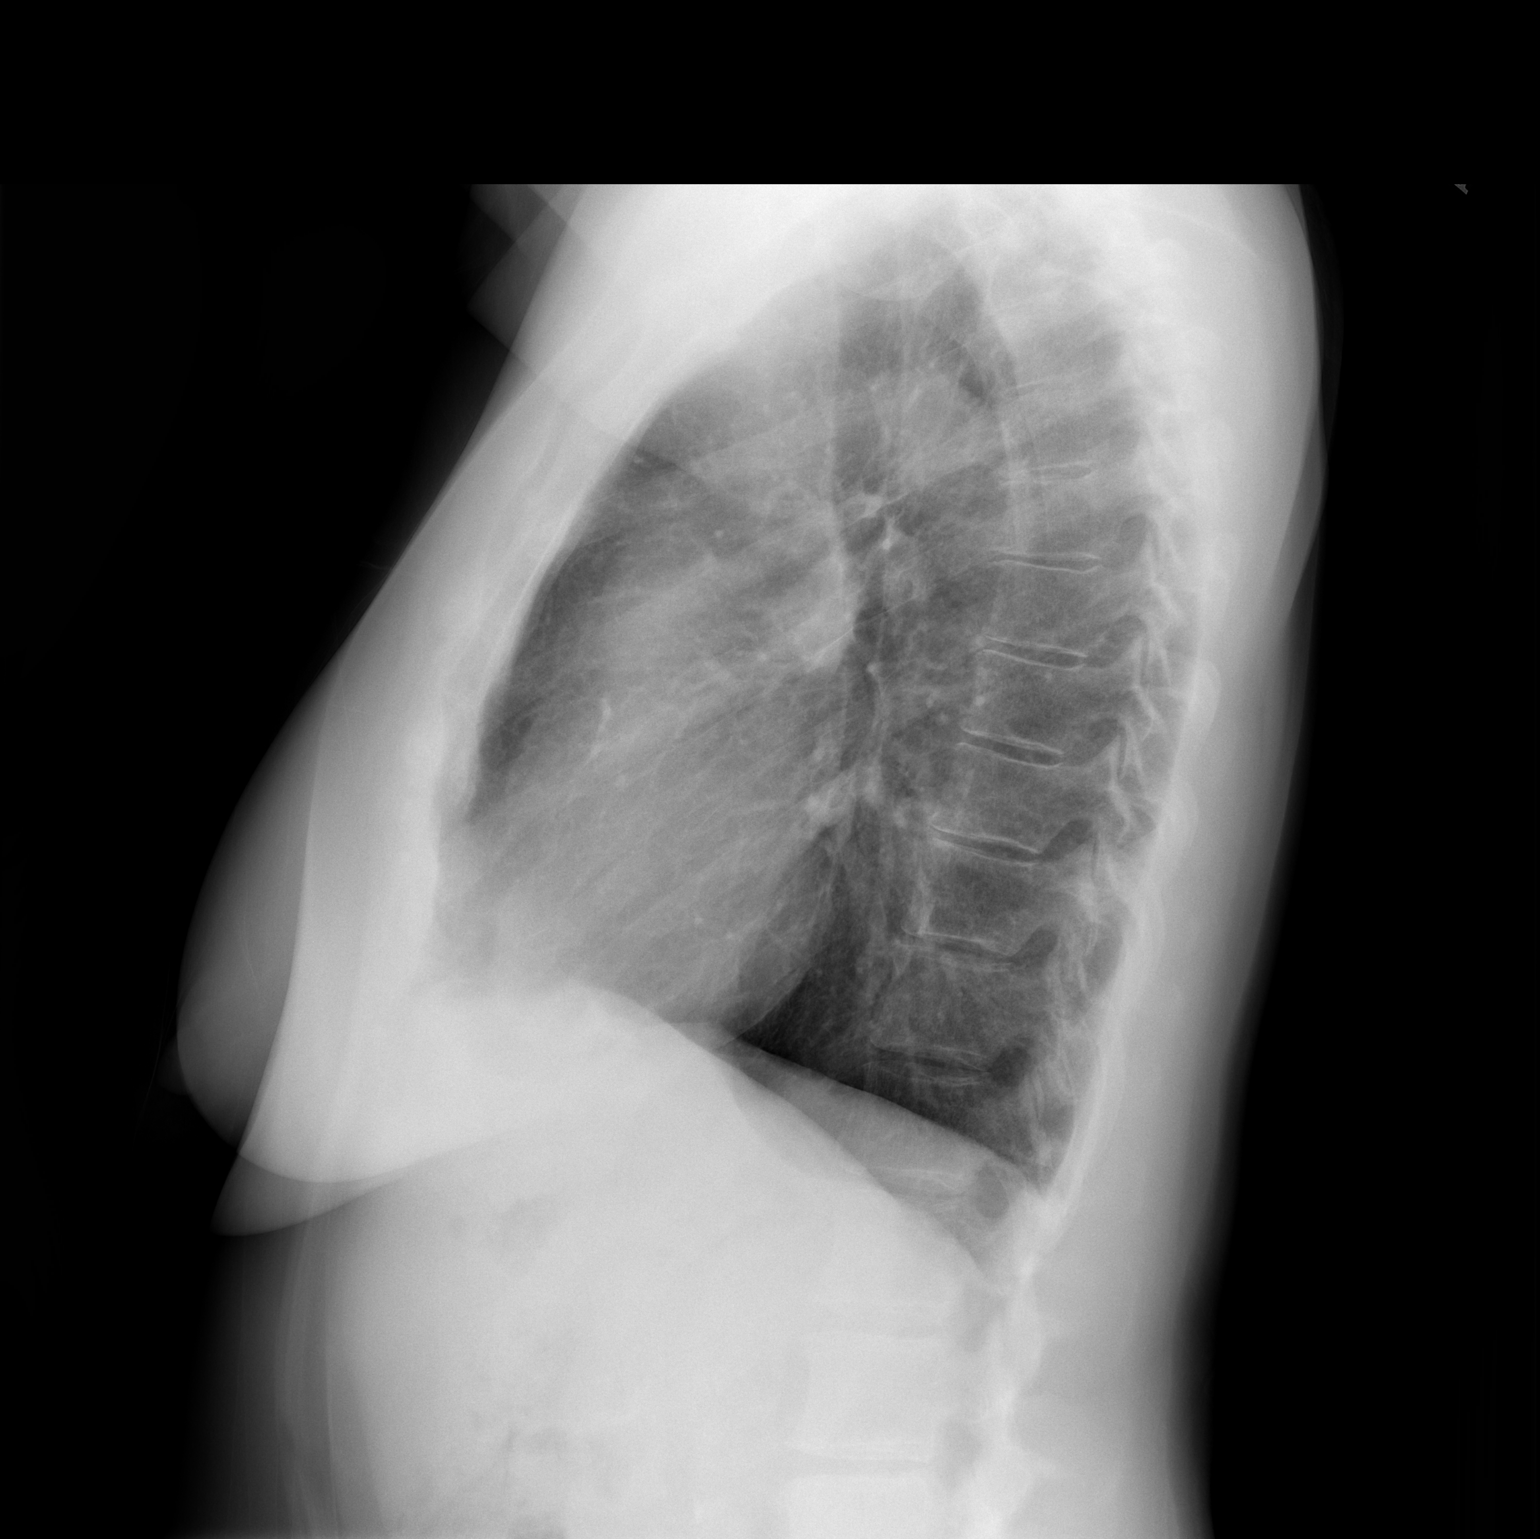

[2 of 2 positions shown; findings below may reference images not displayed]

FINDINGS: No active infiltrate or effusion is seen. Mediastinal and hilar
contours are unremarkable. The heart is within normal limits in
size. No bony abnormality is seen.
IMPRESSION: No active cardiopulmonary disease.
